# Patient Record
Sex: Female | Born: 1970
Health system: Southern US, Community
[De-identification: ages and names within clinical notes are randomized; demographics above are authoritative.]

---

## 2018-05-23 ENCOUNTER — Emergency Department (HOSPITAL_BASED_OUTPATIENT_CLINIC_OR_DEPARTMENT_OTHER)
Admission: EM | Admit: 2018-05-23 | Discharge: 2018-05-24 | Disposition: A | Discharge status: Home / Self Care | Payer: 59 | Attending: Emergency Medicine | Admitting: Emergency Medicine

## 2018-05-23 ENCOUNTER — Other Ambulatory Visit: Payer: Self-pay

## 2018-05-23 DIAGNOSIS — R110 Nausea: Secondary | ICD-10-CM | POA: Diagnosis not present

## 2018-05-23 DIAGNOSIS — R42 Dizziness and giddiness: Secondary | ICD-10-CM | POA: Insufficient documentation

## 2018-05-23 DIAGNOSIS — R51 Headache: Secondary | ICD-10-CM | POA: Diagnosis not present

## 2018-05-23 DIAGNOSIS — H9311 Tinnitus, right ear: Secondary | ICD-10-CM | POA: Insufficient documentation

## 2018-05-23 NOTE — ED Triage Notes (Signed)
Pt states that she has been experiencing dizziness, off balance, nausea, and intermittent ringing of the ear. She states these symptoms have been going on for "awhile". Also complains that she is experiencing pains in the bilateral lower legs that sometimes shoot up her leg

## 2018-05-23 NOTE — ED Triage Notes (Signed)
Pt was able to walk into and from triage unassisted with a steady gait

## 2018-05-24 ENCOUNTER — Emergency Department (HOSPITAL_BASED_OUTPATIENT_CLINIC_OR_DEPARTMENT_OTHER): Payer: 59

## 2018-05-24 LAB — URINALYSIS, MICROSCOPIC (REFLEX)

## 2018-05-24 LAB — CBC WITH DIFFERENTIAL/PLATELET
Abs Immature Granulocytes: 0.01 10*3/uL (ref 0.00–0.07)
BASOS PCT: 0 %
Basophils Absolute: 0 10*3/uL (ref 0.0–0.1)
EOS ABS: 0.2 10*3/uL (ref 0.0–0.5)
Eosinophils Relative: 2 %
HEMATOCRIT: 38.6 % (ref 36.0–46.0)
Hemoglobin: 12.1 g/dL (ref 12.0–15.0)
Immature Granulocytes: 0 %
Lymphocytes Relative: 45 %
Lymphs Abs: 4.6 10*3/uL — ABNORMAL HIGH (ref 0.7–4.0)
MCH: 27.7 pg (ref 26.0–34.0)
MCHC: 31.3 g/dL (ref 30.0–36.0)
MCV: 88.3 fL (ref 80.0–100.0)
MONO ABS: 0.6 10*3/uL (ref 0.1–1.0)
MONOS PCT: 5 %
Neutro Abs: 4.9 10*3/uL (ref 1.7–7.7)
Neutrophils Relative %: 48 %
PLATELETS: 283 10*3/uL (ref 150–400)
RBC: 4.37 MIL/uL (ref 3.87–5.11)
RDW: 13.3 % (ref 11.5–15.5)
WBC: 10.2 10*3/uL (ref 4.0–10.5)
nRBC: 0 % (ref 0.0–0.2)

## 2018-05-24 LAB — COMPREHENSIVE METABOLIC PANEL
ALT: 17 U/L (ref 0–44)
ANION GAP: 8 (ref 5–15)
AST: 14 U/L — ABNORMAL LOW (ref 15–41)
Albumin: 3.8 g/dL (ref 3.5–5.0)
Alkaline Phosphatase: 33 U/L — ABNORMAL LOW (ref 38–126)
BILIRUBIN TOTAL: 0.5 mg/dL (ref 0.3–1.2)
BUN: 13 mg/dL (ref 6–20)
CO2: 27 mmol/L (ref 22–32)
Calcium: 9.3 mg/dL (ref 8.9–10.3)
Chloride: 106 mmol/L (ref 98–111)
Creatinine, Ser: 0.68 mg/dL (ref 0.44–1.00)
GFR calc Af Amer: >60 mL/min (ref >60)
Glucose, Bld: 90 mg/dL (ref 70–99)
POTASSIUM: 3.8 mmol/L (ref 3.5–5.1)
Sodium: 141 mmol/L (ref 135–145)
TOTAL PROTEIN: 7.2 g/dL (ref 6.5–8.1)

## 2018-05-24 LAB — URINALYSIS, ROUTINE W REFLEX MICROSCOPIC
Bilirubin Urine: NEGATIVE
Glucose, UA: NEGATIVE mg/dL
KETONES UR: NEGATIVE mg/dL
LEUKOCYTES UA: NEGATIVE
NITRITE: NEGATIVE
PROTEIN: NEGATIVE mg/dL
Specific Gravity, Urine: 1.025 (ref 1.005–1.030)
pH: 6.5 (ref 5.0–8.0)

## 2018-05-24 LAB — PREGNANCY, URINE: PREG TEST UR: NEGATIVE

## 2018-05-24 MED ORDER — MECLIZINE HCL 25 MG PO TABS
25.0000 mg | ORAL_TABLET | Freq: Once | ORAL | Status: AC
  Administered 2018-05-24: 25 mg via ORAL
  Filled 2018-05-24: qty 1

## 2018-05-24 MED ORDER — MECLIZINE HCL 25 MG PO TABS: 25.0000 mg | ORAL_TABLET | Freq: Three times a day (TID) | ORAL | 0 refills | Status: AC | PRN

## 2018-05-24 MED ORDER — DIAZEPAM 5 MG PO TABS: 5.0000 mg | ORAL_TABLET | Freq: Two times a day (BID) | ORAL | 0 refills | Status: AC | PRN

## 2018-05-24 NOTE — ED Notes (Signed)
ED Provider at bedside. 

## 2018-05-24 NOTE — ED Provider Notes (Signed)
[facility] EMERGENCY DEPARTMENT Provider Note   CSN: [id number] Arrival date & time: 05/23/18  2316     History   Chief Complaint Chief Complaint  Patient presents with  . Dizziness  . Nausea    HPI Jacqueline Hinton is a 47 y.o. female.  HPI   Has had years of on and off dizziness, this episode started 3 days ago Ears started ringing, right ear, feeling off balance started 3 days ago, similar to prior episodes No numbness or weakness that she can tell, no facial droop, maybe every 4-5 hours will have 5 minutes of thinking she might have mild slurred speech, no trouble finding words, no double vision or missing pieces of vision Room spinning dizziness, worsened with sitting up or standing, still present at rest Headache also began 3 days ago, began slowly and worsened. No hx of headaches or migraines  Has had episodes like this over the last several years. Occur approx twice per month. Has not yet had an evaluation.   Nausea, no vomiting  Also reports leg pain today.  No fam hx of early CAD/CVA No sig medical hx. No smoking.  No fam hx of MS  No past medical history on file.  There are no active problems to display for this patient.    OB History   None      Home Medications    Prior to Admission medications   Medication Sig Start Date End Date Taking? Authorizing Provider  diazepam (VALIUM) 5 MG tablet Take 1 tablet (5 mg total) by mouth every 12 (twelve) hours as needed (dizziness). 05/24/18   Alvira Monday, MD  meclizine (ANTIVERT) 25 MG tablet Take 1 tablet (25 mg total) by mouth 3 (three) times daily as needed for dizziness or nausea. 05/24/18   Alvira Monday, MD    Family History No family history on file.  Social History Social History   Tobacco Use  . Smoking status: Not on file  Substance Use Topics  . Alcohol use: Not on file  . Drug use: Not on file     Allergies   Metronidazole and Penicillins   Review of  Systems Review of Systems  Constitutional: Negative for fever.  HENT: Positive for tinnitus.   Eyes: Negative for visual disturbance.  Respiratory: Negative for cough and shortness of breath.   Cardiovascular: Negative for chest pain.  Gastrointestinal: Positive for nausea. Negative for abdominal pain and vomiting.  Genitourinary: Negative for difficulty urinating.  Musculoskeletal: Positive for gait problem. Negative for back pain and neck pain.  Skin: Negative for rash.  Neurological: Positive for dizziness. Negative for syncope, facial asymmetry, speech difficulty, weakness, numbness and headaches.     Physical Exam Updated Vital Signs BP 130/84   Pulse 72   Temp 97.7 F (36.5 C) (Oral)   Resp 19   Ht 5\' 7"  (1.702 m)   Wt 112.9 kg   SpO2 100%   BMI 39.00 kg/m   Physical Exam  Constitutional: She is oriented to person, place, and time. She appears well-developed and well-nourished. No distress.  HENT:  Head: Normocephalic and atraumatic.  Eyes: Pupils are equal, round, and reactive to light. Conjunctivae and EOM are normal.  No vertical skew   Neck: Normal range of motion.  Cardiovascular: Normal rate, regular rhythm, normal heart sounds and intact distal pulses. Exam reveals no gallop and no friction rub.  No murmur heard. Pulmonary/Chest: Effort normal and breath sounds normal. No respiratory distress. She has no wheezes.  She has no rales.  Abdominal: Soft. She exhibits no distension. There is no tenderness. There is no guarding.  Musculoskeletal: She exhibits no edema or tenderness.  Neurological: She is alert and oriented to person, place, and time. She has normal strength. No cranial nerve deficit or sensory deficit. She displays a negative Romberg sign. Coordination and gait normal. GCS eye subscore is 4. GCS verbal subscore is 5. GCS motor subscore is 6.  Skin: Skin is warm and dry. No rash noted. She is not diaphoretic. No erythema.  Nursing note and vitals  reviewed.    ED Treatments / Results  Labs (all labs ordered are listed, but only abnormal results are displayed) Labs Reviewed  CBC WITH DIFFERENTIAL/PLATELET - Abnormal; Notable for the following components:      Result Value   Lymphs Abs 4.6 (*)    All other components within normal limits  COMPREHENSIVE METABOLIC PANEL - Abnormal; Notable for the following components:   AST 14 (*)    Alkaline Phosphatase 33 (*)    All other components within normal limits  URINALYSIS, ROUTINE W REFLEX MICROSCOPIC - Abnormal; Notable for the following components:   APPearance CLOUDY (*)    Hgb urine dipstick SMALL (*)    All other components within normal limits  URINALYSIS, MICROSCOPIC (REFLEX) - Abnormal; Notable for the following components:   Bacteria, UA FEW (*)    All other components within normal limits  PREGNANCY, URINE    EKG EKG Interpretation  Date/Time:  Sunday May 24 2018 00:53:11 EST Ventricular Rate:  58 PR Interval:    QRS Duration: 103 QT Interval:  462 QTC Calculation: 454 R Axis:   47 Text Interpretation:  Sinus rhythm Probable left ventricular hypertrophy Baseline wander in lead(s) V1 Partial missing lead(s): V1 No previous ECGs available Confirmed by Mackayla Mullins ([id number]) on 05/24/2018 1:55:09 AM   Radiology Ct Head Wo Contrast  Result Date: 05/24/2018 CLINICAL DATA:  Dizziness nausea and off balance. EXAM: CT HEAD WITHOUT CONTRAST TECHNIQUE: Contiguous axial images were obtained from the base of the skull through the vertex without intravenous contrast. COMPARISON:  None. FINDINGS: Brain: The ventricles are normal in size and configuration. No extra-axial fluid collections are identified. The gray-white differentiation is maintained. No CT findings for acute hemispheric infarction or intracranial hemorrhage. No mass lesions. The brainstem and cerebellum are normal. Scattered dural calcifications. Vascular: No hyperdense vessels or obvious aneurysm. Skull:  No acute skull fracture. No bone lesion. Sinuses/Orbits: The paranasal sinuses and mastoid air cells are clear. The globes are intact. Other: No scalp lesions, laceration or hematoma. IMPRESSION: Normal head CT. Electronically Signed   By: P.  Gallerani M.D.   On: 05/24/2018 01:02    Procedures Procedures (including critical care time)  Medications Ordered in ED Medications  meclizine (ANTIVERT) tablet 25 mg (25 mg Oral Given 05/24/18 0058)     Initial Impression / Assessment and Plan / ED Course  I have reviewed the triage vital signs and the nursing notes.  Pertinent labs & imaging results that were available during my care of the patient were reviewed by me and considered in my medical decision making (see chart for details).     47  year old female no significant medical history presents with concern for episodes of vertigo and nausea.  Differential diagnosis for dizziness includes central causes such as stroke, intracranial bleed, mass and peripheral causes such as BPPV, meniere's disease, viral.  Vertigo is positional, patient has normal neurologic exam, including normal gait and coordination,  with no risk factors for stroke and have low suspicion for CVA. Labs show no evidence of anemia, significant electrolyte abnormality. Given headache, CT done which shows no evidence of abnormalities.   Suspect peripheral cause of vertigo given above, with patient describing episodes of vertigo and tinnitus for years. Possible meniere's disease, although with headache would consider migraine.  Given concern for peripheral vertigo, possible meniere's, will refer to ENT for further evaluation.  Patient improved with meclizine.  Will give rx for meclizine and valium. Patient discharged in stable condition with understanding of reasons to return.   Final Clinical Impressions(s) / ED Diagnoses   Final diagnoses:  Vertigo    ED Discharge Orders         Ordered    meclizine (ANTIVERT) 25 MG tablet   3 times daily PRN     05/24/18 0203    diazepam (VALIUM) 5 MG tablet  Every 12 hours PRN     05/24/18 1610           Alvira Monday, MD 05/24/18 [phone]

## 2018-05-24 NOTE — ED Notes (Signed)
Patient transported to CT 

## 2018-05-24 NOTE — ED Notes (Signed)
Pt states "still dizzy". Denies pain.

## 2018-06-06 ENCOUNTER — Other Ambulatory Visit: Payer: Self-pay

## 2018-06-06 ENCOUNTER — Emergency Department (HOSPITAL_BASED_OUTPATIENT_CLINIC_OR_DEPARTMENT_OTHER)
Admission: EM | Admit: 2018-06-06 | Discharge: 2018-06-06 | Disposition: A | Discharge status: Home / Self Care | Payer: 59 | Attending: Emergency Medicine | Admitting: Emergency Medicine

## 2018-06-06 ENCOUNTER — Encounter (HOSPITAL_BASED_OUTPATIENT_CLINIC_OR_DEPARTMENT_OTHER): Payer: Self-pay | Admitting: Emergency Medicine

## 2018-06-06 ENCOUNTER — Emergency Department (HOSPITAL_BASED_OUTPATIENT_CLINIC_OR_DEPARTMENT_OTHER): Payer: 59

## 2018-06-06 DIAGNOSIS — M545 Low back pain: Secondary | ICD-10-CM | POA: Diagnosis present

## 2018-06-06 DIAGNOSIS — M25511 Pain in right shoulder: Secondary | ICD-10-CM

## 2018-06-06 DIAGNOSIS — M7918 Myalgia, other site: Secondary | ICD-10-CM | POA: Diagnosis not present

## 2018-06-06 DIAGNOSIS — Y9389 Activity, other specified: Secondary | ICD-10-CM | POA: Insufficient documentation

## 2018-06-06 DIAGNOSIS — M791 Myalgia, unspecified site: Secondary | ICD-10-CM

## 2018-06-06 DIAGNOSIS — Z79899 Other long term (current) drug therapy: Secondary | ICD-10-CM | POA: Diagnosis not present

## 2018-06-06 DIAGNOSIS — S39012A Strain of muscle, fascia and tendon of lower back, initial encounter: Secondary | ICD-10-CM | POA: Diagnosis not present

## 2018-06-06 DIAGNOSIS — Y92410 Unspecified street and highway as the place of occurrence of the external cause: Secondary | ICD-10-CM | POA: Diagnosis not present

## 2018-06-06 DIAGNOSIS — Y999 Unspecified external cause status: Secondary | ICD-10-CM | POA: Diagnosis not present

## 2018-06-06 MED ORDER — NAPROXEN 500 MG PO TABS: 500.0000 mg | ORAL_TABLET | Freq: Two times a day (BID) | ORAL | 0 refills | Status: AC | PRN

## 2018-06-06 MED ORDER — METHOCARBAMOL 500 MG PO TABS: 500.0000 mg | ORAL_TABLET | Freq: Two times a day (BID) | ORAL | 0 refills | Status: AC | PRN

## 2018-06-06 MED ORDER — NAPROXEN 250 MG PO TABS
500.0000 mg | ORAL_TABLET | Freq: Once | ORAL | Status: AC
  Administered 2018-06-06: 500 mg via ORAL
  Filled 2018-06-06: qty 2

## 2018-06-06 MED ORDER — METHOCARBAMOL 500 MG PO TABS
500.0000 mg | ORAL_TABLET | Freq: Once | ORAL | Status: DC
  Filled 2018-06-06: qty 1
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
## 2018-06-06 NOTE — Discharge Instructions (Addendum)
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
## 2020-02-23 ENCOUNTER — Encounter (HOSPITAL_BASED_OUTPATIENT_CLINIC_OR_DEPARTMENT_OTHER): Payer: Self-pay | Admitting: Emergency Medicine

## 2020-02-23 ENCOUNTER — Emergency Department (HOSPITAL_BASED_OUTPATIENT_CLINIC_OR_DEPARTMENT_OTHER)
Admission: EM | Admit: 2020-02-23 | Discharge: 2020-02-23 | Disposition: A | Discharge status: Home / Self Care | Payer: 59 | Attending: Emergency Medicine | Admitting: Emergency Medicine

## 2020-02-23 ENCOUNTER — Other Ambulatory Visit: Payer: Self-pay

## 2020-02-23 DIAGNOSIS — R21 Rash and other nonspecific skin eruption: Secondary | ICD-10-CM

## 2020-02-23 DIAGNOSIS — B029 Zoster without complications: Secondary | ICD-10-CM | POA: Diagnosis not present

## 2020-02-23 MED ORDER — VALACYCLOVIR HCL 1 G PO TABS: 1000.0000 mg | ORAL_TABLET | Freq: Three times a day (TID) | ORAL | 0 refills | Status: AC

## 2020-02-23 MED ORDER — HYDROCODONE-ACETAMINOPHEN 5-325 MG PO TABS: 2.0000 | ORAL_TABLET | ORAL | 0 refills | Status: AC | PRN

## 2020-02-23 MED FILL — valACYclovir HCL 1 GM TABS: 1 | 7 days supply | Qty: 21 | Fill #0

## 2020-02-23 MED FILL — HYDROCODON-APAP 5-325: 5-325 | 1 days supply | Qty: 6 | Fill #0

## 2020-02-23 NOTE — ED Provider Notes (Signed)
[facility]  [facility] EMERGENCY DEPARTMENT Provider Note   CSN: [id number] Arrival date & time: 02/23/20  1037     History Chief Complaint  Patient presents with  . Rash    Jacqueline Hinton is a 49 y.o. female.  HPI Patient is a 49 year old female with no past medical history presented today with rash on the right side of her back which is painful burning itching that is 10/10 pain seems to radiate into her right armpit.  She states she has some weird sensations with felt like a spider crawling on her back prior to the pain starting.  She states that the symptoms began on Sunday.  She has never had this before.  She states she may have had chickenpox as a child but does not remember.  She is never been vaccinated.  She has not had a shingles vaccination.  She denies any fever chills nausea vomiting diarrhea headache lightheadedness dizziness chest pain or shortness of breath abdominal pain.  She is taken nothing prior to arrival apart from Zyrtec which she states was only minimally helpful and that itch less but no difference in pain.    History reviewed. No pertinent past medical history.  There are no problems to display for this patient.   History reviewed. No pertinent surgical history.   OB History   No obstetric history on file.     History reviewed. No pertinent family history.  Social History   Tobacco Use  . Smoking status: Not on file  Substance Use Topics  . Alcohol use: Not on file  . Drug use: Not on file    Home Medications Prior to Admission medications   Medication Sig Start Date End Date Taking? Authorizing Provider  diazepam (VALIUM) 5 MG tablet Take 1 tablet (5 mg total) by mouth every 12 (twelve) hours as needed (dizziness). 05/24/18   Alvira Monday, MD  HYDROcodone-acetaminophen (NORCO/VICODIN) 5-325 MG tablet Take 2 tablets by mouth every 4 (four) hours as needed. 02/23/20   Gailen Shelter, PA  meclizine (ANTIVERT) 25 MG tablet Take 1  tablet (25 mg total) by mouth 3 (three) times daily as needed for dizziness or nausea. 05/24/18   Alvira Monday, MD  methocarbamol (ROBAXIN) 500 MG tablet Take 1 tablet (500 mg total) by mouth 2 (two) times daily as needed. 06/06/18   Ward, Chase Picket, PA-C  naproxen (NAPROSYN) 500 MG tablet Take 1 tablet (500 mg total) by mouth 2 (two) times daily as needed. 06/06/18   Ward, Chase Picket, PA-C  valACYclovir (VALTREX) 1000 MG tablet Take 1 tablet (1,000 mg total) by mouth 3 (three) times daily for 7 days. 02/23/20 03/01/20  Gailen Shelter, PA    Allergies    Metronidazole and Penicillins  Review of Systems   Review of Systems  Constitutional: Negative for fever.  HENT: Negative for congestion.   Respiratory: Negative for shortness of breath.   Cardiovascular: Negative for chest pain.  Gastrointestinal: Negative for abdominal distention.  Skin: Positive for rash.  Neurological: Negative for dizziness and headaches.    Physical Exam Updated Vital Signs BP (!) 126/92   Pulse 75   Temp 98.8 F (37.1 C) (Oral)   Resp 18   SpO2 100%   Physical Exam Vitals and nursing note reviewed.  Constitutional:      General: She is not in acute distress.    Appearance: Normal appearance. She is not ill-appearing.  HENT:     Head: Normocephalic and atraumatic.  Eyes:  General: No scleral icterus.       Right eye: No discharge.        Left eye: No discharge.     Conjunctiva/sclera: Conjunctivae normal.  Pulmonary:     Effort: Pulmonary effort is normal.     Breath sounds: No stridor.  Skin:    General: Skin is warm and dry.     Comments: To circles of vesicular rash on erythematous base.  These plaques of erythematous base are approximately 4 cm in diameter.  Very severely tender to palpation. No fluctuance or streaking   Neurological:     Mental Status: She is alert and oriented to person, place, and time. Mental status is at baseline.       ED Results / Procedures /  Treatments   Labs (all labs ordered are listed, but only abnormal results are displayed) Labs Reviewed - No data to display  EKG None  Radiology No results found.  Procedures Procedures (including critical care time)  Medications Ordered in ED Medications - No data to display  ED Course  I have reviewed the triage vital signs and the nursing notes.  Pertinent labs & imaging results that were available during my care of the patient were reviewed by me and considered in my medical decision making (see chart for details).    MDM Rules/Calculators/A&P                          Patient with her shingles rash.  Will prescribe Valtrex and Norco for pain.   Patient counseled extensively on the disease.  She is agreeable to plan and discharge.  Will return to ED for any new concerning symptoms.  Will follow up with PCP for recheck.  Final Clinical Impression(s) / ED Diagnoses Final diagnoses:  Herpes zoster without complication  Rash    Rx / DC Orders ED Discharge Orders         Ordered    valACYclovir (VALTREX) 1000 MG tablet  3 times daily     Discontinue  Reprint     02/23/20 1149    HYDROcodone-acetaminophen (NORCO/VICODIN) 5-325 MG tablet  Every 4 hours PRN     Discontinue  Reprint     02/23/20 [address] [place], [place] 02/23/20 1201    Jacalyn Lefevre, MD 02/23/20 1445

## 2020-02-23 NOTE — ED Triage Notes (Signed)
Pt here with blistering rash that resembles shingles on right side of back and extreme pain that radiates into right underarm. Noticed since Saturday.

## 2020-02-23 NOTE — Discharge Instructions (Signed)
You have a shingles rash. This is caused by a virus.  I am prescribing antiviral.  Please take as prescribed.  I have called the pharmacy here at [facility] to ensure that you get the cheapest option. Please use Tylenol or ibuprofen for pain.  You may use 600 mg ibuprofen every 6 hours or 1000 mg of Tylenol every 6 hours.  You may choose to alternate between the 2.  This would be most effective.  Not to exceed 4 g of Tylenol within 24 hours.  Not to exceed 3200 mg ibuprofen 24 hours.  If this is not able to control your pain you may use Norco as prescribed.  I will provide you with a short course of 6 tablets to use for severe pain especially at nighttime.  It can make you very drowsy.  You can also use Benadryl 25 mg every 6 hours for itching and discomfort.  We will hold off on prednisone at this time as it is an extra expense and I think the antiviral medication will be the most helpful for you.  As reminder this is a very contagious disease.  You do have active fluid-filled vesicles which can pop and spread fluid which can cause disease.  Please keep area covered when in public.  Please do not be around any pregnant people or immunosuppressed individual such as those with HIV or on steroid such as we discussed earlier.

## 2023-11-04 ENCOUNTER — Emergency Department (HOSPITAL_BASED_OUTPATIENT_CLINIC_OR_DEPARTMENT_OTHER)

## 2023-11-04 ENCOUNTER — Emergency Department (HOSPITAL_BASED_OUTPATIENT_CLINIC_OR_DEPARTMENT_OTHER)
Admission: EM | Admit: 2023-11-04 | Discharge: 2023-11-04 | Disposition: A | Discharge status: Home / Self Care | Attending: Emergency Medicine | Admitting: Emergency Medicine

## 2023-11-04 ENCOUNTER — Other Ambulatory Visit: Payer: Self-pay

## 2023-11-04 ENCOUNTER — Encounter (HOSPITAL_BASED_OUTPATIENT_CLINIC_OR_DEPARTMENT_OTHER): Payer: Self-pay | Admitting: Emergency Medicine

## 2023-11-04 DIAGNOSIS — M5431 Sciatica, right side: Secondary | ICD-10-CM

## 2023-11-04 DIAGNOSIS — J45909 Unspecified asthma, uncomplicated: Secondary | ICD-10-CM | POA: Diagnosis not present

## 2023-11-04 DIAGNOSIS — M5441 Lumbago with sciatica, right side: Secondary | ICD-10-CM | POA: Diagnosis not present

## 2023-11-04 DIAGNOSIS — M25561 Pain in right knee: Secondary | ICD-10-CM | POA: Diagnosis not present

## 2023-11-04 DIAGNOSIS — M545 Low back pain, unspecified: Secondary | ICD-10-CM | POA: Diagnosis present

## 2023-11-04 MED ORDER — ZIKS ARTHRITIS PAIN RELIEF 0.025-1-12 % EX CREA: 1.0000 | TOPICAL_CREAM | CUTANEOUS | 0 refills | Status: AC | PRN

## 2023-11-04 MED ORDER — METAXALONE 800 MG PO TABS: 800.0000 mg | ORAL_TABLET | Freq: Three times a day (TID) | ORAL | 0 refills | Status: AC

## 2023-11-04 NOTE — ED Triage Notes (Addendum)
 PT with bilateral leg pain, worse on the right.  States she has low back pain as well. No known injury.  This began Tuesday at work.  Burning pain described in legs.  Pt slow to ambulate d/t pain. Pt has had a hysterectomy

## 2023-11-04 NOTE — ED Provider Notes (Signed)
 [place] [facility]PARTMENT AT [facility] Provider Note   CSN: [id number] Arrival date & time: 11/04/23  0272     History  Chief Complaint  Patient presents with   Leg Pain   Back Pain    Jacqueline Hinton is a 53 y.o. female.   Leg Pain Associated symptoms: back pain   Back Pain Associated symptoms: leg pain   Patient is a 53 year old female presents the ED today with complaints of low back pain and right knee pain that has progressively gotten worse over the last week.  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  Noticed that her knees have started popping a lot more regularly over the course last few years.  Now states that she can barely bend her right knee and is having increased back pain, described as sharp and shooting down her legs bilaterally.  She also endorses difficulty bending the knee due to the pain that she is feeling in her right knee.  Denies fever, IV drug use, trauma/fall, saddle paresthesia, weakness, numbness, tingling in lower extremities.     Home Medications Prior to Admission medications   Medication Sig Start Date End Date Taking? Authorizing Provider  Capsaicin-Menthol-Methyl Sal (CAPSAICIN-METHYL SAL-MENTHOL) 0.025-1-12 % CREA Apply 1 Applicatorful topically as needed (3-4 times a day). 11/04/23  Yes Aurorah Schlachter S, PA-C  metaxalone  (SKELAXIN ) 800 MG tablet Take 1 tablet (800 mg total) by mouth 3 (three) times daily. 11/04/23  Yes Jenika Chiem S, PA-C  diazepam  (VALIUM ) 5 MG tablet Take 1 tablet (5 mg total) by mouth every 12 (twelve) hours as needed (dizziness). 05/24/18   Scarlette Currier, MD  HYDROcodone -acetaminophen  (NORCO/VICODIN) 5-325 MG tablet Take 2 tablets by mouth every 4 (four) hours as needed. 02/23/20   Coretta Dexter, PA  meclizine  (ANTIVERT ) 25 MG tablet Take 1 tablet (25 mg total) by mouth 3 (three) times daily as needed  for dizziness or nausea. 05/24/18   Scarlette Currier, MD  methocarbamol  (ROBAXIN ) 500 MG tablet Take 1 tablet (500 mg total) by mouth 2 (two) times daily as needed. 06/06/18   Ward, Veto Gowda, PA-C  naproxen  (NAPROSYN ) 500 MG tablet Take 1 tablet (500 mg total) by mouth 2 (two) times daily as needed. 06/06/18   Ward, Veto Gowda, PA-C      Allergies    Metronidazole and Penicillins    Review of Systems   Review of Systems  Musculoskeletal:  Positive for arthralgias and back pain.  All other systems reviewed and are negative.   Physical Exam Updated Vital Signs BP (!) 137/104 (BP Location: Left Arm)   Pulse 87   Temp 97.6 F (36.4 C) (Oral)   Resp 19   SpO2 97%  Physical Exam Vitals and nursing note reviewed.  Constitutional:      General: She is not in acute distress.    Appearance: Normal appearance. She is not ill-appearing.  HENT:     Head: Normocephalic and atraumatic.  Eyes:     Extraocular Movements: Extraocular movements intact.     Conjunctiva/sclera: Conjunctivae normal.  Cardiovascular:     Rate and Rhythm: Normal rate and regular rhythm.     Pulses: Normal pulses.     Heart sounds: Normal heart sounds. No murmur heard.    No friction rub. No gallop.  Pulmonary:     Effort: Pulmonary effort is normal. No respiratory distress.  Breath sounds: Normal breath sounds.  Abdominal:     General: Abdomen is flat.     Palpations: Abdomen is soft.     Tenderness: There is no abdominal tenderness.  Musculoskeletal:        General: Tenderness (Tenderness noted over the right knee both medially and laterally around the patella border edges bilaterally) present. No swelling, deformity or signs of injury.     Cervical back: No rigidity.  Skin:    General: Skin is warm and dry.     Coloration: Skin is not pale.     Findings: No bruising, erythema, lesion or rash.     Comments: Skin over area has a normal temperature, not hot to the touch, no erythema noted.   Neurological:     General: No focal deficit present.     Mental Status: She is alert and oriented to person, place, and time. Mental status is at baseline.     Sensory: No sensory deficit.     Motor: No weakness.     Gait: Gait abnormal (Patient is noted to have a pain to gait when walking).  Psychiatric:        Mood and Affect: Mood normal.     ED Results / Procedures / Treatments   Labs (all labs ordered are listed, but only abnormal results are displayed) Labs Reviewed - No data to display  EKG None  Radiology DG Knee Complete 4 Views Right Result Date: 11/04/2023 CLINICAL DATA:  Increased pain and swelling EXAM: RIGHT KNEE - COMPLETE 4+ VIEW COMPARISON:  08/26/2019 FINDINGS: No fracture or malalignment. Moderate patellofemoral degenerative changes. Trace knee effusion. Mild medial joint space degenerative change. IMPRESSION: Degenerative changes, greatest in the patellofemoral compartment. Trace knee effusion. Electronically Signed   By: Esmeralda Hedge M.D.   On: 11/04/2023 22:04   DG Lumbar Spine Complete Result Date: 11/04/2023 CLINICAL DATA:  Low back pain, bilateral leg pain. EXAM: LUMBAR SPINE - COMPLETE 4+ VIEW COMPARISON:  Radiograph 06/06/2018 FINDINGS: Five non-rib-bearing lumbar vertebra. Trace anterolisthesis of L4 on L5. Anterior spurring at multiple levels, mild L4-L5 disc space narrowing. Moderate L4-L5 mild L5-S1 facet hypertrophy. No evidence of fracture, pars defects or focal bone abnormalities. The sacroiliac joints are congruent. IMPRESSION: 1. Mild degenerative disc disease at L4-L5. 2. Moderate L4-L5 and mild L5-S1 facet hypertrophy. 3. Trace anterolisthesis of L4 on L5. Electronically Signed   By: Chadwick Colonel M.D.   On: 11/04/2023 19:05    Procedures Procedures    Medications Ordered in ED Medications - No data to display  ED Course/ Medical Decision Making/ A&P                                 Medical Decision Making Amount and/or Complexity of  Data Reviewed Radiology: ordered.  Risk OTC drugs. Prescription drug management.   This patient is a 53 year old female who presents to the ED for concern of low back pain and right knee pain that has been present particularly for the last week, noting that she has had pain and knee popping for many years.  On physical exam, patient is in no acute distress, afebrile, alert and orient x 4, speaking in full sentences, nontachypneic, nontachycardic. Patient is notably tender to palpation over the knee joint however no warmth over the joint space, no erythema, no swelling compared to left knee.  Patient is able to have full range of motion however is painful.  No pain with passive range of motion.  No midline tenderness to lumbar spine however paraspinal tenderness noted around the lumbar region.  Unremarkable exam otherwise  X-rays reveal degenerative changes to both back and right knee, will prescribe muscle relaxer for back to help out with muscle soreness as well as will prescribe capsaicin cream for her right knee as well as have her follow-up with orthopedics for further management.  Patient vital signs have remained stable throughout the course of patient's time in the ED. Low suspicion for any other emergent pathology at this time. I believe this patient is safe to be discharged. Provided strict return to ER precautions. Patient expressed agreement and understanding of plan. All questions were answered.  Differential diagnoses prior to evaluation: The emergent differential diagnosis includes, but is not limited to, arthritis, septic joint, gout, fracture, ligamentous injury. This is not an exhaustive differential.   Past Medical History / Co-morbidities / Social History: Multinodular goiter, chest pain, asthma, thyroid disease, arthritis  Additional history: Chart reviewed. Pertinent results include: Patient last seen by prior provider on 09/24/2023  Lab Tests/Imaging studies: I  personally interpreted labs/imaging and the pertinent results include:    X-ray of right knee shows degenerative changes greatest in the patellofemoral compartment with trace knee effusion X-ray of lumbar spine shows mild degenerative disc disease from L4-L5 moderate L4-L5 and mild L5-S1 facet hypertrophy as well as trace anterolisthesis of L4 on L5.   I agree with the radiologist interpretation.  Medications: I ordered medication including Skelaxin , capsaicin cream.  I have reviewed the patients home medicines and have made adjustments as needed.   Disposition: After consideration of the diagnostic results and the patients response to treatment, I feel that the patient would benefit from discharge and treatment as above.   emergency department workup does not suggest an emergent condition requiring admission or immediate intervention beyond what has been performed at this time. The plan is: Follow-up with Ortho, capsaicin cream for right knee, Skelaxin  for low back and symptomatic management at home, return for new or worsening symptoms. The patient is safe for discharge and has been instructed to return immediately for worsening symptoms, change in symptoms or any other concerns.  Final Clinical Impression(s) / ED Diagnoses Final diagnoses:  Sciatica of right side  Acute pain of right knee    Rx / DC Orders ED Discharge Orders          Ordered    metaxalone  (SKELAXIN ) 800 MG tablet  3 times daily        11/04/23 2213    Capsaicin-Menthol-Methyl Sal (CAPSAICIN-METHYL SAL-MENTHOL) 0.025-1-12 % CREA  As needed        11/04/23 2213              Hayes Lipps, PA-C 11/04/23 2241    Sallyanne Creamer, DO 11/07/23 2122

## 2023-11-04 NOTE — Discharge Instructions (Addendum)
 You were seen today for right knee pain and low back pain.  You do have some degenerative changes noted to your low back as noted on x-ray as well as a slight movement in your spine moving forward which can be causing some of this pain that you have been experiencing.  Recommend continue taking the ibuprofen and Tylenol  for this.  However we will also add a muscle relaxer as well to help out with this pain medication.  I am also providing a capsaicin cream which can use over the knee which can help with this pain that you are experiencing as well.  Recommend you continue to follow-up with orthopedics as they are the ones you are going to be helping with this long-term.  Be sure to follow-up with orthopedics as the degenerative changes noted in your knee are most likely the cause of this pain that you have been experiencing.  I have low suspicion for any infectious cause at this time.  However if you begin to have any sort of fever, worsening swelling with redness over the joint, the area becomes hot to the touch, return to ED for further evaluation.
